# Patient Record
Sex: Female | Born: 1975 | Race: White | Hispanic: No | Marital: Married
Health system: Midwestern US, Community
[De-identification: ages and names within clinical notes are randomized; demographics above are authoritative.]

## PROBLEM LIST (undated history)

## (undated) DIAGNOSIS — R3129 Other microscopic hematuria: Secondary | ICD-10-CM

## (undated) DIAGNOSIS — R319 Hematuria, unspecified: Secondary | ICD-10-CM

## (undated) DIAGNOSIS — N800 Endometriosis of uterus: Secondary | ICD-10-CM

## (undated) DIAGNOSIS — N8003 Adenomyosis of the uterus: Secondary | ICD-10-CM

## (undated) DIAGNOSIS — R569 Unspecified convulsions: Secondary | ICD-10-CM

## (undated) DIAGNOSIS — I10 Essential (primary) hypertension: Secondary | ICD-10-CM

## (undated) DIAGNOSIS — N83209 Unspecified ovarian cyst, unspecified side: Secondary | ICD-10-CM

## (undated) DIAGNOSIS — N809 Endometriosis, unspecified: Secondary | ICD-10-CM

---

## 2014-04-15 ENCOUNTER — Ambulatory Visit
Admit: 2014-04-15 | Discharge: 2014-04-15 | Payer: PRIVATE HEALTH INSURANCE | Attending: Urology | Primary: Family Medicine

## 2014-04-15 DIAGNOSIS — R3129 Other microscopic hematuria: Secondary | ICD-10-CM

## 2014-04-15 LAB — AMB POC URINALYSIS DIP STICK AUTO W/ MICRO (MICRO RESULTS)
Bilirubin (UA POC): NEGATIVE
Glucose (UA POC): NEGATIVE
Ketones (UA POC): NEGATIVE
Leukocyte esterase (UA POC): NEGATIVE
Nitrites (UA POC): NEGATIVE
Protein (UA POC): NEGATIVE mg/dL
Specific gravity (UA POC): 1.015 (ref 1.001–1.035)
Urobilinogen (UA POC): 0.2 (ref 0.2–1)
pH (UA POC): 7 (ref 4.6–8.0)

## 2014-04-15 NOTE — Progress Notes (Signed)
ASSESSMENT:     ICD-10-CM ICD-9-CM    1. Microscopic hematuria R31.2 599.72 AMB POC URINALYSIS DIP STICK AUTO W/ MICRO (MICRO RESULTS)      [place] RETROPERITONEUM COMP   2. History of UTI Z87.440 V13.02               PLAN:    The American Urological Association's Best Practice Policy Panel on asymptomatic microscopic hematuria defines microscopic hematuria as three or more red blood cells per high-power microscopic field in urinary sediment from two of three properly collected urinalysis specimens.  According to this patient's history, the criteria for a urologic workup are met and we will proceed accordingly with upper tract imaging. Discussed causes for microscopic hematuria, which include renal stones, malignancy and UTI.     As she is low risk for significant abnormalities, I advised patient to proceed with a renal US. This is to be ordered and scheduled today.    Urine for culture today. Will contact patient with results.  RTC 1 month to review imaging studies. If imaging is negative, she will return in 1 year with UA and micro at that time.          Chief Complaint   Patient presents with   ??? Referral / Consult     Patient referred for blood in urine. Patient was treated with ABX by PCP x yr       HISTORY OF PRESENT ILLNESS:  Michele Choi is a 38 y.o. female who is seen in consultation as referred by Dr. Avelina Laine for hematuria. She is a former smoker - 1/2 PPD 4 pack years. UA on 10/12 by her PCP showed moderate blood (3-5 RBC per HPF).     She has a longstanding history of microscopic hematuria dating back to 2008. She was treated 1 year ago with abx for the microscopic hematuria.     She has 2 children (1 vaginal birth and 1 C section). She denies history of kidney stones. She has a history of UTI - none in the past several years. She reports when she was pregnant her clean catch urine always showed Group B strep. Cathed specimens were negative. f     She currently denies urinary difficulty, dysuria, gross hematuria and incontinence. She does note bilateral flank pain that radiates to the groin - lasts 20-30 seconds at most and happens once a day. UA today shows trace blood, otherwise normal. Micro normal            Past Medical History   Diagnosis Date   ??? Pet allergy    ??? Abnormal Pap smear of vagina and vaginal HPV    ??? Cardiac arrhythmia    ??? Paroxysmal ventricular tachycardia ([place])    ??? PCOS (polycystic ovarian syndrome)        Past Surgical History   Procedure Laterality Date   ??? Hx leep procedure     ??? Hx acl reconstruction     ??? Hx meniscectomy     ??? Hx cyst removal     ??? Hx colonoscopy     ??? Hx wisdom teeth extraction     ??? Hx oophorectomy         History   Substance Use Topics   ??? Smoking status: Former Smoker   ??? Smokeless tobacco: Not on file   ??? Alcohol Use: Yes       Allergies   Allergen Reactions   ??? Avelox [Moxifloxacin] Other (comments)   ???  Keflex [Cephalexin] Other (comments)       Family History   Problem Relation Age of Onset   ??? Hypertension Mother    ??? Other Mother      heart valve replacement   ??? Cancer Father      Prostate       Current Outpatient Prescriptions   Medication Sig Dispense Refill   ??? labetalol (NORMODYNE) 100 mg tablet   0   ??? valACYclovir (VALTREX) 500 mg tablet   0         Review of Systems  Constitutional: Fever: No  Skin: Rash: No  HEENT: Hearing difficulty: No  Eyes: Blurred vision: Yes  seen by eye doctor  Cardiovascular: Chest pain: No  Respiratory: Shortness of breath: No  Gastrointestinal: Nausea/vomiting: No  Musculoskeletal: Back pain: No  Neurological: Weakness: No  Psychological: Memory loss: No  Comments/additional findings:         PHYSICAL EXAMINATION:     BP 110/70 mmHg   Ht '5\' 6"'  (1.676 m)   Wt 217 lb (98.431 kg)   BMI 35.04 kg/m2  Constitutional: Well developed, well-nourished female in no acute distress.   CV:  No peripheral swelling noted  Respiratory: No respiratory distress or difficulties   Abdomen:  Soft and nontender. No masses. No hepatosplenomegaly.   GU Female:  No CVA tenderness.     Skin:  Normal color. No evidence of jaundice.     Neuro/Psych:  Patient with appropriate affect.  Alert and oriented.    Lymphatic:   No enlargement of supraclavicular lymph nodes.      REVIEW OF LABS AND IMAGING:      Results for orders placed or performed in visit on 04/15/14   AMB POC URINALYSIS DIP STICK AUTO W/ MICRO (MICRO RESULTS)   Result Value Ref Range    Color (UA POC) Yellow     Clarity (UA POC) Clear     Glucose (UA POC) Negative Negative    Bilirubin (UA POC) Negative Negative    Ketones (UA POC) Negative Negative    Specific gravity (UA POC) 1.015 1.001 - 1.035    Blood (UA POC) Trace Negative    pH (UA POC) 7.0 4.6 - 8.0    Protein (UA POC) Negative Negative mg/dL    Urobilinogen (UA POC) 0.2 mg/dL 0.2 - 1    Nitrites (UA POC) Negative Negative    Leukocyte esterase (UA POC) Negative Negative    Epithelial cells (UA POC)      WBCs (UA POC)      RBCs (UA POC)      Bacteria (UA POC)  Negative    Crystals (UA POC)  Negative    Other (UA POC)             A copy of today's office visit with all pertinent imaging results and labs were sent to the referring physician.        Kathreen Cornfield, MD         Documentation provided by Fredrik Cove, medical scribe for Dr. Quinn Axe, on 04/15/2014

## 2014-04-17 LAB — URINE C&S

## 2014-05-12 ENCOUNTER — Encounter

## 2014-05-13 ENCOUNTER — Ambulatory Visit
Admit: 2014-05-13 | Discharge: 2014-05-13 | Payer: PRIVATE HEALTH INSURANCE | Attending: Urology | Primary: Family Medicine

## 2014-05-13 DIAGNOSIS — R319 Hematuria, unspecified: Secondary | ICD-10-CM

## 2014-05-13 LAB — AMB POC URINALYSIS DIP STICK AUTO W/O MICRO
Bilirubin (UA POC): NEGATIVE
Glucose (UA POC): NEGATIVE
Ketones (UA POC): NEGATIVE
Leukocyte esterase (UA POC): NEGATIVE
Nitrites (UA POC): NEGATIVE
Protein (UA POC): NEGATIVE mg/dL
Specific gravity (UA POC): 1.025 (ref 1.001–1.035)
Urobilinogen (UA POC): 0.2 (ref 0.2–1)
pH (UA POC): 5.5 (ref 4.6–8.0)

## 2014-05-13 NOTE — Progress Notes (Signed)
ASSESSMENT:     ICD-10-CM ICD-9-CM    1. Hematuria R31.9 599.70 AMB POC URINALYSIS DIP STICK AUTO W/O MICRO             PLAN:    RUS reviewed with patient  Urine C&S reviewed with patient, 30k Beta Strep species on 04/15/14  Urine for culture today  Discussed bladder wall thickening with patient - discussed that cysto would be low yield but patient is worried about  F/u in 2 weeks for cystoscopy    DISCUSSION:  The American Urological Association's Best Practice Policy Panel on asymptomatic microscopic hematuria defines microscopic hematuria as three or more red blood cells per high-power microscopic field in urinary sediment from two of three properly collected urinalysis specimens.  According to this patient's history, the criteria for a urologic workup are met and we will proceed accordingly with upper tract imaging. Discussed causes for microscopic hematuria, which include renal stones, malignancy and UTI.     Chief Complaint   Patient presents with   ??? Hematuria       HISTORY OF PRESENT ILLNESS:  Michele Choi is a 38 y.o. female who is seen in consultation as referred by Dr. Avelina Laine for hematuria. She is a former smoker - 1/2 PPD for 7 years. UA on 10/12 by her PCP showed moderate blood (3-5 RBC per HPF).     She has a longstanding history of microscopic hematuria dating back to 2008. She was treated 1 year ago with abx for the microscopic hematuria.     She has 2 children (1 vaginal birth and 1 C section). She denies history of kidney stones. She has a history of UTI - none in the past several years. She reports when she was pregnant her clean catch urine always showed Group B strep. Cathed specimens were negative.     She continues to deny major urinary difficulties, dysuria, gross hematuria and incontinence. UA today continues to show trace blood, otherwise normal.   She reports a weaker flow in the morning.     Patient is currently on Labetalol, prescribed by cardiologist while she was pregnant.        Past Medical History   Diagnosis Date   ??? Pet allergy    ??? Abnormal Pap smear of vagina and vaginal HPV    ??? Cardiac arrhythmia    ??? Paroxysmal ventricular tachycardia ([place])    ??? PCOS (polycystic ovarian syndrome)    ??? Hematuria    ??? History of UTI        Past Surgical History   Procedure Laterality Date   ??? Hx leep procedure     ??? Hx acl reconstruction     ??? Hx meniscectomy     ??? Hx cyst removal     ??? Hx colonoscopy     ??? Hx wisdom teeth extraction     ??? Hx oophorectomy         History   Substance Use Topics   ??? Smoking status: Former Smoker   ??? Smokeless tobacco: Not on file   ??? Alcohol Use: Yes       Allergies   Allergen Reactions   ??? Avelox [Moxifloxacin] Other (comments)   ??? Keflex [Cephalexin] Other (comments)       Family History   Problem Relation Age of Onset   ??? Hypertension Mother    ??? Other Mother      heart valve replacement   ??? Cancer Father  Prostate       Current Outpatient Prescriptions   Medication Sig Dispense Refill   ??? labetalol (NORMODYNE) 100 mg tablet   0   ??? valACYclovir (VALTREX) 500 mg tablet   0       Review of Systems  Constitutional: Fever: No  Skin: Rash: No  HEENT: Hearing difficulty: No  Eyes: Blurred vision: No  Cardiovascular: Chest pain: No  Respiratory: Shortness of breath: No  Gastrointestinal: Nausea/vomiting: No  Musculoskeletal: Back pain: No  Neurological: Weakness: No  Psychological: Memory loss: No  Comments/additional findings:       PHYSICAL EXAMINATION:   BP 110/79 mmHg   Ht '5\' 6"'  (1.676 m)   Wt 217 lb (98.431 kg)   BMI 35.04 kg/m2  Constitutional: Well developed, well-nourished female in no acute distress.   CV:  No peripheral swelling noted  Respiratory: No respiratory distress or difficulties  Abdomen:  Soft and nontender. No masses. No hepatosplenomegaly.   GU Female:  No CVA tenderness.     Skin:  Normal color. No evidence of jaundice.     Neuro/Psych:  Patient with appropriate affect.  Alert and oriented.     Lymphatic:   No enlargement of supraclavicular lymph nodes.      REVIEW OF LABS AND IMAGING:    Results for orders placed or performed in visit on 05/13/14   AMB POC URINALYSIS DIP STICK AUTO W/O MICRO   Result Value Ref Range    Color (UA POC)      Clarity (UA POC)      Glucose (UA POC) Negative Negative    Bilirubin (UA POC) Negative Negative    Ketones (UA POC) Negative Negative    Specific gravity (UA POC) 1.025 1.001 - 1.035    Blood (UA POC) Trace Negative    pH (UA POC) 5.5 4.6 - 8.0    Protein (UA POC) Negative Negative mg/dL    Urobilinogen (UA POC) 0.2 mg/dL 0.2 - 1    Nitrites (UA POC) Negative Negative    Leukocyte esterase (UA POC) Negative Negative     RUS 04/28/14  Procedures Performed: Exam Date/Time: Reason for Exam:   [place] RETROPERITONEAL  [id number] 04/28/2014 8:23 AM None Specified     [place] RETROPERITONEAL  HISTORY: Microscopic hematuria.  FINDINGS:  Real-time sonogram of the kidneys and urinary bladder was done.  The kidneys are normal in echotexture and size. The corticomedullary echo differentiation is well-preserved.   No renal mass, stone or obstruction. The right kidney measures 11.6 x 4.6 x 5.2 cm and the left kidney measures 12.5 x 5.2 x 5.5 cm.  Urinary bladder is only minimally distended showing no definite stone or mass.   Under distention of the bladder resulted in slight bladder wall thickening. Ureteral jets demonstrated in both sides.    IMPRESSION:  No definite abnormalities of the kidneys. No gross abnormality of the urinary bladder allowing for suboptimal distention.    Dictated by: Carren Rang ALI on Tue Apr 28, 2014 8:33:00 AM EST     Signed GH:[id number], Mertie Clause, MD 04/28/2014 8:37 AM    Urine C&S 04/15/14  COLONY COUNT/RESULT:  30,000 cfu/ml    RESULT/ORG ID #1:  Beta Strep species, (NOT group B), Drug of Choice is Ampicillin or Penicillin per CLSI guidelines            A copy of today's office visit with all pertinent imaging results and labs  were sent to the referring physician.  Kathreen Cornfield, MD       Medical documentation is provided with the assistance of Nikki L. Yolanda Bonine, medical scribe for Kathreen Cornfield, MD on 05/13/2014.

## 2014-05-27 ENCOUNTER — Ambulatory Visit
Admit: 2014-05-27 | Discharge: 2014-05-27 | Payer: PRIVATE HEALTH INSURANCE | Attending: Urology | Primary: Family Medicine

## 2014-05-27 DIAGNOSIS — R3129 Other microscopic hematuria: Secondary | ICD-10-CM

## 2014-05-27 LAB — AMB POC URINALYSIS DIP STICK AUTO W/ MICRO (MICRO RESULTS)
Bilirubin (UA POC): NEGATIVE
Glucose (UA POC): NEGATIVE
Ketones (UA POC): NEGATIVE
Leukocyte esterase (UA POC): NEGATIVE
Nitrites (UA POC): NEGATIVE
Protein (UA POC): NEGATIVE mg/dL
Specific gravity (UA POC): 1.015 (ref 1.001–1.035)
Urobilinogen (UA POC): 0.2 (ref 0.2–1)
pH (UA POC): 5.5 (ref 4.6–8.0)

## 2014-05-27 NOTE — Progress Notes (Signed)
CYSTOSCOPY PROCEDURE        Patient Name: Michele Choi            Date of Procedure: 05/27/2014     Pre-procedure Diagnosis:     ICD-10-CM ICD-9-CM    1. Microscopic hematuria R31.2 599.72       Post-procedure Diagnosis: Same    Consent:  All risks, benefits and options were reviewed in detail and the patient agrees to procedure. Risks include but are not limited to bleeding, infection, sepsis, death, dysuria and others.     Procedure:  The patient was placed in the lithotomy position, and prepped and draped in the normal fashion. 5 ml of 4% Lidocaine gel was placed in the urethra. Once adequate anesthesia was achieved; the flexible cystoscope was placed into the bladder. The procedure was conducted with narrow band imaging and white light cystoscopy      Findings as follows:      Meatus: normal  Urethra: normal  Bladder neck: normal  Trigone:  normal  Trabeculation:normal  Diverticuli:  none  Lesion:  none    Antibiotic provided:  Yes     Lab / Imaging:   No results found for any visits on 05/27/14.     Diagnoses:       ICD-10-CM ICD-9-CM    1. Microscopic hematuria R31.2 599.72           SUMMARY OF VISIT AND PLAN:    38yoF with history of microscopic hematuria and smoking history  U Cx 30K strep  RUS w/o hydro/stones.  Noted some bladder wall thickening likely 2/2 collapsed bladder      F/u with UA and micro in 1 year      Manual Meierouglas C Juliahna Wiswell, MD

## 2015-11-10 ENCOUNTER — Encounter: Attending: Urology | Primary: Family Medicine

## 2015-11-10 NOTE — Telephone Encounter (Signed)
Called and cancelled today because she has a stomach bug and will call us to reschedule.

## 2016-02-09 ENCOUNTER — Ambulatory Visit
Admit: 2016-02-09 | Discharge: 2016-02-09 | Payer: PRIVATE HEALTH INSURANCE | Attending: Urology | Primary: Family Medicine

## 2016-02-09 DIAGNOSIS — R319 Hematuria, unspecified: Secondary | ICD-10-CM

## 2016-02-09 LAB — AMB POC URINALYSIS DIP STICK AUTO W/ MICRO (MICRO RESULTS)
Bilirubin (UA POC): NEGATIVE
Glucose (UA POC): NEGATIVE
Ketones (UA POC): NEGATIVE
Leukocyte esterase (UA POC): NEGATIVE
Nitrites (UA POC): NEGATIVE
Protein (UA POC): NEGATIVE mg/dL
Specific gravity (UA POC): 1.03 (ref 1.001–1.035)
Urobilinogen (UA POC): 0.2 (ref 0.2–1)
pH (UA POC): 5.5 (ref 4.6–8.0)

## 2016-02-09 NOTE — Progress Notes (Signed)
Michele Choi  DOB Dec 25, 1975  40 y.o.   02/11/2016       UROLOGY ESTABLISHED PATIENT       ASSESSMENT:     ICD-10-CM ICD-9-CM   1. Hematuria R31.9 599.70        40yoM with hx of persistent asymptomatic microscopic hematuria s/p hematuria work up 12/15 including cysto/RUS       PLAN:    Straight cath for urine sample today, moderate blood. 3-5 rbcs on micro  Patient with persistent microscopic hematuria  CTU ordered.   Cytology today, results pending.   Will complete repeat hematuria workup via cysto.   RTC in 3 weeks for cysto.       DISCUSSION:  The American Urological Association's Best Practice Policy Panel on asymptomatic microscopic hematuria defines microscopic hematuria as three or more red blood cells per high-power microscopic field in urinary sediment from two of three properly collected urinalysis specimens.  According to this patient's history, the criteria for a urologic workup are met and we will proceed accordingly with upper tract imaging. Discussed causes for microscopic hematuria, which include renal stones, malignancy and UTI.     Chief Complaint   Patient presents with   ??? Hematuria       HISTORY OF PRESENT ILLNESS:  Michele Choi is a 40 y.o. female who is seen in follow up for persistent asymptomatic microscopic hematuria.   Pt has a longstanding history of microscopic hematuria dating back to 2008.   H/o treatment 1 year ago with abx for the microscopic hematuria.   This did not resolve her chronic findings which continue.   UA today positive for 3+ blood w/o evidence of infection.   She is a former smoker - 1/2 PPD for 7 years.     Pt continues to deny major urinary difficulties.  Denies dysuria, gross hematuria and incontinence.  No h/o kidney stones.   She has a history of UTI - none in the past several years.   Pt has 2 children (1 vaginal birth and 1 C section).   RUS 11/15   Cysto 12/15 negative for etiology    Past Medical History:   Diagnosis Date    ??? Abnormal Pap smear of vagina and vaginal HPV    ??? Cardiac arrhythmia    ??? Hematuria    ??? History of UTI    ??? Paroxysmal ventricular tachycardia ([place])    ??? PCOS (polycystic ovarian syndrome)    ??? Pet allergy      Past Surgical History:   Procedure Laterality Date   ??? HX ACL RECONSTRUCTION     ??? HX COLONOSCOPY     ??? HX CYST REMOVAL     ??? HX LEEP PROCEDURE     ??? HX MENISCECTOMY     ??? HX OOPHORECTOMY     ??? HX WISDOM TEETH EXTRACTION       Social History   Substance Use Topics   ??? Smoking status: Former Smoker   ??? Smokeless tobacco: None   ??? Alcohol use Yes     Allergies   Allergen Reactions   ??? Avelox [Moxifloxacin] Other (comments)   ??? Keflex [Cephalexin] Other (comments)     Family History   Problem Relation Age of Onset   ??? Hypertension Mother    ??? Other Mother      heart valve replacement   ??? Cancer Father      Prostate     Current Outpatient Prescriptions  Medication Sig Dispense Refill   ??? JUNEL FE 1/20, 28, 1 mg-20 mcg (21)/75 mg (7) tab take 1 tablet by mouth once daily  0   ??? labetalol (NORMODYNE) 100 mg tablet   0   ??? valACYclovir (VALTREX) 500 mg tablet   0       Review of Systems  Constitutional: Fever: No  Skin: Rash: No  HEENT: Hearing difficulty: No  Eyes: Blurred vision: No  Cardiovascular: Chest pain: No  Respiratory: Shortness of breath: No  Gastrointestinal: Nausea/vomiting: No  Musculoskeletal: Back pain: Yes  occasional radiating lower back pain  Neurological: Weakness:   Psychological: Memory loss: No  Comments/additional findings:       PHYSICAL EXAMINATION:   Visit Vitals   ??? BP 116/66   ??? Ht '5\' 6"'  (1.676 m)   ??? Wt 230 lb (104.3 kg)   ??? BMI 37.12 kg/m2   Constitutional: Well developed, well-nourished female in no acute distress.   CV:  No peripheral swelling noted  Respiratory: No respiratory distress or difficulties  Abdomen:  Soft and nontender. No masses. No hepatosplenomegaly.   GU Female:  No CVA tenderness.     Skin:  Normal color. No evidence of jaundice.      Neuro/Psych:  Patient with appropriate affect.  Alert and oriented.    Lymphatic:   No enlargement of supraclavicular lymph nodes.      REVIEW OF LABS AND IMAGING:    Results for orders placed or performed in visit on 02/09/16   URINE C&S   Result Value Ref Range    FINAL REPORT Microbiology results    AMB POC URINALYSIS DIP STICK AUTO W/ MICRO (MICRO RESULTS)   Result Value Ref Range    Color (UA POC)      Clarity (UA POC)      Glucose (UA POC) Negative Negative    Bilirubin (UA POC) Negative Negative    Ketones (UA POC) Negative Negative    Specific gravity (UA POC) 1.030 1.001 - 1.035    Blood (UA POC) 2+ Negative    pH (UA POC) 5.5 4.6 - 8.0    Protein (UA POC) Negative Negative mg/dL    Urobilinogen (UA POC) 0.2 mg/dL 0.2 - 1    Nitrites (UA POC) Negative Negative    Leukocyte esterase (UA POC) Negative Negative    Epithelial cells (UA POC)      WBCs (UA POC) 5-10     RBCs (UA POC) 3-5     Bacteria (UA POC)  Negative    Crystals (UA POC)  Negative    Other (UA POC)         CMP 12/29/2015    Component Name Value Ref Range   POTASSIUM 4.2 3.5 - 5.5 mmol/L   SODIUM 137 133 - 145 mmol/L   CHLORIDE 96 (L) 98 - 110 mmol/L   GLUCOSE 81 65 - 99 mg/dL   CALCIUM 9.0 8.4 - 10.5 mg/dL   ALBUMIN 4.4 3.5 - 5.0 g/dL   SGPT (ALT) 11 5 - 40 U/L   SGOT (AST) 11 10 - 37 U/L   BILIRUBIN TOTAL 0.6 0.2 - 1.2 mg/dL   ALKALINE PHOSPHATASE 69 25 - 115 U/L   BUN 11 6 - 22 mg/dL   CO2 26 20 - 32 mmol/L   CREATININE 0.7 0.5 - 1.2 mg/dL   eGFR African American >60.0 >60.0    eGFR Non African American >60.0 >60.0    GLOBULIN SERUM 2.7 2.0 - 4.0  g/dL   A/G RATIO 1.6 1.1 - 2.6 ratio    TOTAL PROTEIN 7.1 6.4 - 8.3 g/dL   ANION GAP 15.0 mmol/L   Specimen   Blood         A copy of today's office visit with all pertinent imaging results and labs were sent to the referring physician.        Kathreen Cornfield, MD  [facility]  [place], [place]  Phone: [phone]  Pager: [phone]        Patient's BMI is out of the normal parameters.  Information about BMI was given to the patient.       Medical Documentation is provided with the assistance of Taj-ud-Din Arsenio Loader, Medical Scribe for Kathreen Cornfield, MD on 02/09/2016.

## 2016-02-11 LAB — CYTOLOGY NON GYN, UROLOGY OF ~~LOC~~ LAB

## 2016-02-11 LAB — URINE C&S

## 2016-02-11 NOTE — Progress Notes (Signed)
Reviewed, follow up as scheduled

## 2016-02-15 NOTE — Progress Notes (Signed)
Appointment Information   Name: Michele Choi, Michele Choi MRN: [id number]   Date: 02/25/2016 Status: Sch   Time: 1:40 PM Length: 20   Visit Type: CT UROGRAPHY [15319] Visit #: [id number]   Prov/Res: [facility] CT RM 1 Department: [facility] CAT SCAN   Ordering Provider: Manual Meier

## 2016-02-15 NOTE — Progress Notes (Signed)
Faxed imaging to [facility]

## 2016-03-02 ENCOUNTER — Encounter

## 2016-03-03 ENCOUNTER — Ambulatory Visit
Admit: 2016-03-03 | Discharge: 2016-03-03 | Payer: PRIVATE HEALTH INSURANCE | Attending: Urology | Primary: Family Medicine

## 2016-03-03 DIAGNOSIS — R319 Hematuria, unspecified: Secondary | ICD-10-CM

## 2016-03-03 LAB — AMB POC URINALYSIS DIP STICK AUTO W/O MICRO
Bilirubin (UA POC): NEGATIVE
Glucose (UA POC): NEGATIVE
Ketones (UA POC): NEGATIVE
Leukocyte esterase (UA POC): NEGATIVE
Nitrites (UA POC): NEGATIVE
Protein (UA POC): NEGATIVE mg/dL
Specific gravity (UA POC): 1.03 (ref 1.001–1.035)
Urobilinogen (UA POC): 0.2 (ref 0.2–1)
pH (UA POC): 5.5 (ref 4.6–8.0)

## 2016-03-03 NOTE — Progress Notes (Signed)
LAYNEE LOCKAMY  DOB 11/08/1975   Encounter date 03/03/2016      CYSTOSCOPY PROCEDURE        Patient Name: Michele Choi            Date of Procedure: 03/03/2016     Pre-procedure Diagnosis:     ICD-10-CM ICD-9-CM    1. Hematuria R31.9 599.70 AMB POC URINALYSIS DIP STICK AUTO W/O MICRO      CYSTOURETHROSCOPY      Persistent microscopic hematuria.   - Cytology 02/09/16: negative for malignancy    - CTU 02/28/16: no evidence of urothelial carcinoma    - S/p cystoscopy 03/03/16: normal findings    Post-procedure Diagnosis: Same    Indication:   Michele Choi is a 40 y.o. female who is seen in follow up for persistent asymptomatic microscopic hematuria dating back to 2008.     She is a former smoker - 1/2 PPD for 7 years.     In regards to voiding, she has no major complaints  Denies any gross hematuria or dysuria  No significant urgency, frequency, hesitancy, intermittency, post-void dribbling, or incontinence.  No history of nephrolithiasis   No significant history of UTIs    Pt has 2 children (1 vaginal birth and 1 C section).     Consent:  All risks, benefits and options were reviewed in detail and the patient agrees to procedure. Risks include but are not limited to bleeding, infection, sepsis, death, dysuria and others.     Procedure:  The patient was placed in the lithotomy position, and prepped and draped in the normal fashion. 5 ml of 4% Lidocaine gel was placed in the urethra. Once adequate anesthesia was achieved; the flexible cystoscope was placed into the bladder.     Findings as follows:      Meatus: normal  Urethra: normal  Bladder neck: normal  Trigone:  normal  Trabeculation:normal  Diverticuli:  none  Lesion:  none    Antibiotic provided:  Yes     Lab / Imaging:   Results for orders placed or performed in visit on 03/03/16   AMB POC URINALYSIS DIP STICK AUTO W/O MICRO   Result Value Ref Range    Color (UA POC)      Clarity (UA POC)      Glucose (UA POC) Negative Negative    Bilirubin (UA POC) Negative Negative     Ketones (UA POC) Negative Negative    Specific gravity (UA POC) 1.030 1.001 - 1.035    Blood (UA POC) 2+ Negative    pH (UA POC) 5.5 4.6 - 8.0    Protein (UA POC) Negative Negative mg/dL    Urobilinogen (UA POC) 0.2 mg/dL 0.2 - 1    Nitrites (UA POC) Negative Negative    Leukocyte esterase (UA POC) Negative Negative      Cytology 02/09/16  CYTOLOGIC DIAGNOSIS:   ??   Negative for malignancy. ??Reactive urothelial cells.     CT Urogram 02/28/16  IMPRESSION:  ??  1. No evidence of nephrolithiasis or obstructive nephropathy.  2. No filling defects within the renal collecting systems, ureters, or urinary bladder to suggest the presence of urothelial carcinoma.    Diagnoses:       ICD-10-CM ICD-9-CM    1. Hematuria R31.9 599.70 AMB POC URINALYSIS DIP STICK AUTO W/O MICRO      CYSTOURETHROSCOPY      40yoF with persistent AMH    SUMMARY OF VISIT AND PLAN:  CTU reviewed - no evidence of malignancy   Cytology negative  Cystoscopy today - normal findings  Patient has had multiple negative hematuria workups  Will follow up PRN - instructed patient to follow up if gross hematuria      Manual Meier, MD            Medical documentation provided with the assistance of Glo Herring, medical scribe for Manual Meier, MD

## 2020-12-26 ENCOUNTER — Emergency Department (HOSPITAL_COMMUNITY)
Admission: EM | Admit: 2020-12-26 | Discharge: 2020-12-26 | Disposition: A | Discharge status: Home / Self Care | Payer: Commercial Managed Care - HMO | Attending: Emergency Medicine | Admitting: Emergency Medicine

## 2020-12-26 ENCOUNTER — Other Ambulatory Visit: Payer: Self-pay

## 2020-12-26 ENCOUNTER — Encounter (HOSPITAL_COMMUNITY): Payer: Self-pay

## 2020-12-26 ENCOUNTER — Emergency Department (HOSPITAL_COMMUNITY): Payer: Commercial Managed Care - HMO

## 2020-12-26 DIAGNOSIS — Z87440 Personal history of urinary (tract) infections: Secondary | ICD-10-CM | POA: Diagnosis not present

## 2020-12-26 DIAGNOSIS — D259 Leiomyoma of uterus, unspecified: Secondary | ICD-10-CM

## 2020-12-26 DIAGNOSIS — R102 Pelvic and perineal pain: Secondary | ICD-10-CM

## 2020-12-26 DIAGNOSIS — I10 Essential (primary) hypertension: Secondary | ICD-10-CM | POA: Diagnosis not present

## 2020-12-26 DIAGNOSIS — N809 Endometriosis, unspecified: Secondary | ICD-10-CM | POA: Diagnosis not present

## 2020-12-26 DIAGNOSIS — Z113 Encounter for screening for infections with a predominantly sexual mode of transmission: Secondary | ICD-10-CM | POA: Insufficient documentation

## 2020-12-26 DIAGNOSIS — R1031 Right lower quadrant pain: Secondary | ICD-10-CM | POA: Diagnosis present

## 2020-12-26 DIAGNOSIS — D251 Intramural leiomyoma of uterus: Secondary | ICD-10-CM | POA: Diagnosis not present

## 2020-12-26 DIAGNOSIS — Z880 Allergy status to penicillin: Secondary | ICD-10-CM | POA: Insufficient documentation

## 2020-12-26 DIAGNOSIS — Z881 Allergy status to other antibiotic agents status: Secondary | ICD-10-CM | POA: Diagnosis not present

## 2020-12-26 HISTORY — DX: Endometriosis, unspecified: N80.9

## 2020-12-26 HISTORY — DX: Unspecified ovarian cyst, unspecified side: N83.209

## 2020-12-26 HISTORY — DX: Unspecified convulsions: R56.9

## 2020-12-26 HISTORY — DX: Essential (primary) hypertension: I10

## 2020-12-26 HISTORY — DX: Adenomyosis of the uterus: N80.03

## 2020-12-26 HISTORY — DX: Endometriosis of uterus: N80.0

## 2020-12-26 LAB — CBC WITH DIFFERENTIAL/PLATELET
Abs Immature Granulocytes: 0.03 10*3/uL (ref 0.00–0.07)
Basophils Absolute: 0.1 10*3/uL (ref 0.0–0.1)
Basophils Relative: 1 %
Eosinophils Absolute: 0.2 10*3/uL (ref 0.0–0.5)
Eosinophils Relative: 2 %
HCT: 41.1 % (ref 36.0–46.0)
Hemoglobin: 13.4 g/dL (ref 12.0–15.0)
Immature Granulocytes: 0 %
Lymphocytes Relative: 24 %
Lymphs Abs: 2.2 10*3/uL (ref 0.7–4.0)
MCH: 26.5 pg (ref 26.0–34.0)
MCHC: 32.6 g/dL (ref 30.0–36.0)
MCV: 81.4 fL (ref 80.0–100.0)
Monocytes Absolute: 0.6 10*3/uL (ref 0.1–1.0)
Monocytes Relative: 7 %
Neutro Abs: 6.1 10*3/uL (ref 1.7–7.7)
Neutrophils Relative %: 66 %
Platelets: 274 10*3/uL (ref 150–400)
RBC: 5.05 MIL/uL (ref 3.87–5.11)
RDW: 13.2 % (ref 11.5–15.5)
WBC: 9.2 10*3/uL (ref 4.0–10.5)
nRBC: 0 % (ref 0.0–0.2)

## 2020-12-26 LAB — WET PREP, GENITAL
Sperm: NONE SEEN
Trich, Wet Prep: NONE SEEN
Yeast Wet Prep HPF POC: NONE SEEN

## 2020-12-26 LAB — URINALYSIS, ROUTINE W REFLEX MICROSCOPIC
Bacteria, UA: NONE SEEN
Bilirubin Urine: NEGATIVE
Glucose, UA: NEGATIVE mg/dL
Ketones, ur: NEGATIVE mg/dL
Nitrite: NEGATIVE
Protein, ur: NEGATIVE mg/dL
Specific Gravity, Urine: 1.023 (ref 1.005–1.030)
pH: 5 (ref 5.0–8.0)

## 2020-12-26 LAB — BASIC METABOLIC PANEL
Anion gap: 12 (ref 5–15)
BUN: 11 mg/dL (ref 6–20)
CO2: 27 mmol/L (ref 22–32)
Calcium: 9.4 mg/dL (ref 8.9–10.3)
Chloride: 99 mmol/L (ref 98–111)
Creatinine, Ser: 0.75 mg/dL (ref 0.44–1.00)
GFR, Estimated: >60 mL/min (ref >60)
Glucose, Bld: 93 mg/dL (ref 70–99)
Potassium: 3.4 mmol/L — ABNORMAL LOW (ref 3.5–5.1)
Sodium: 138 mmol/L (ref 135–145)

## 2020-12-26 LAB — PREGNANCY, URINE: Preg Test, Ur: NEGATIVE

## 2020-12-26 LAB — HIV ANTIBODY (ROUTINE TESTING W REFLEX): HIV Screen 4th Generation wRfx: NONREACTIVE

## 2020-12-26 NOTE — ED Notes (Signed)
Urine Culture sent with specimen

## 2020-12-26 NOTE — ED Triage Notes (Signed)
Pt reports lower abdominal pain x2 weeks. Pt reports taking antibiotics for UTI, and symptoms have not resolved. Pt denies N/V/D, vaginal bleeding, and rectal bleeding. Pt unsure if she had a period last month.

## 2020-12-26 NOTE — ED Provider Notes (Signed)
[place] DEPT Provider Note   CSN: [id number] Arrival date & time: 12/26/20  1503     History Chief Complaint  Patient presents with   Abdominal Pain    Erica Christensen is a 45 y.o. female.  HPI She reports lower abdominal pain, present for 2 days, which improves when taking ibuprofen.  She also had urinary frequency without bleeding.  She occasionally has a sensation that her right leg will give out, because of discomfort in her right lower abdomen.  She was treated for UTI with Macrobid, 2 weeks ago.  She denies vaginal discharge.  Her periods are regular since she had a uterine ablation.  This procedure was apparently done because of heavy bleeding, at which time she was diagnosed with endometriosis and adenomyosis.  She has a history of ovarian cyst but cannot recall if they are bilateral.  She denies fever, chills, vomiting, cough or chest pain.  She denies vaginal bleeding or vaginal discharge at this time.  There are no other known active modifying factors.    Past Medical History:  Diagnosis Date   Adenomyosis    Endometriosis    Hypertension    Ovarian cyst    Seizures ([place])     There are no problems to display for this patient.   History reviewed. No pertinent surgical history.   OB History   No obstetric history on file.     History reviewed. No pertinent family history.     Home Medications Prior to Admission medications   Not on File    Allergies    Allegra [fexofenadine], Amoxicillin, Augmentin [amoxicillin-pot clavulanate], Azithromycin, Bactrim [sulfamethoxazole-trimethoprim], and Penicillins  Review of Systems   Review of Systems  All other systems reviewed and are negative.  Physical Exam Updated Vital Signs BP 103/70   Pulse 67   Temp 97.9 F (36.6 C) (Oral)   Resp 13   Ht 5\' 3"  (1.6 m)   Wt 98.4 kg   LMP  (LMP Unknown)   SpO2 100%   BMI 38.44 kg/m   Physical Exam Vitals and nursing note reviewed.   Constitutional:      Appearance: She is well-developed. She is obese.  HENT:     Head: Normocephalic and atraumatic.  Eyes:     Conjunctiva/sclera: Conjunctivae normal.     Pupils: Pupils are equal, round, and reactive to light.  Neck:     Trachea: Phonation normal.  Cardiovascular:     Rate and Rhythm: Normal rate.  Pulmonary:     Effort: Pulmonary effort is normal.  Chest:     Chest wall: No tenderness.  Abdominal:     General: There is no distension.     Palpations: Abdomen is soft.     Tenderness: There is abdominal tenderness (Diffuse lower quadrants bilaterally). There is no guarding or rebound.  Genitourinary:    Comments: Normal external female genitalia.  No vaginal discharge.  Normal-appearing cervix.  No cervical motion tenderness.  On bimanual examination is no palpable organomegaly.  Exam limited secondary to body habitus. Musculoskeletal:        General: Normal range of motion.     Cervical back: Normal range of motion and neck supple.  Skin:    General: Skin is warm and dry.  Neurological:     Mental Status: She is alert and oriented to person, place, and time.     Motor: No abnormal muscle tone.  Psychiatric:        Mood and  Affect: Mood normal.        Behavior: Behavior normal.        Thought Content: Thought content normal.        Judgment: Judgment normal.    ED Results / Procedures / Treatments   Labs (all labs ordered are listed, but only abnormal results are displayed) Labs Reviewed  WET PREP, GENITAL - Abnormal; Notable for the following components:      Result Value   Clue Cells Wet Prep HPF POC PRESENT (*)    WBC, Wet Prep HPF POC RARE (*)    All other components within normal limits  URINALYSIS, ROUTINE W REFLEX MICROSCOPIC - Abnormal; Notable for the following components:   Hgb urine dipstick SMALL (*)    Leukocytes,Ua TRACE (*)    All other components within normal limits  BASIC METABOLIC PANEL - Abnormal; Notable for the following  components:   Potassium 3.4 (*)    All other components within normal limits  CBC WITH DIFFERENTIAL/PLATELET  PREGNANCY, URINE  RPR  HIV ANTIBODY (ROUTINE TESTING W REFLEX)  GC/CHLAMYDIA PROBE AMP ([place]) NOT AT [facility]    EKG None  Radiology US PELVIC COMPLETE WITH TRANSVAGINAL  Result Date: 12/26/2020 CLINICAL DATA:  Pelvic pain.  Endometriosis. EXAM: TRANSABDOMINAL AND TRANSVAGINAL ULTRASOUND OF PELVIS TECHNIQUE: Both transabdominal and transvaginal ultrasound examinations of the pelvis were performed. Transabdominal technique was performed for global imaging of the pelvis including uterus, ovaries, adnexal regions, and pelvic cul-de-sac. It was necessary to proceed with endovaginal exam following the transabdominal exam to visualize the endometrium. COMPARISON:  None FINDINGS: Uterus Measurements: 12.2 x 6.3 x 8.1 cm = volume: 325 mL. Myometrial mass in the anterior uterine fundus measures 2.3 x 1.8 x 2.2 cm. Endometrium Thickness: Cannot accurately measure, heterogeneous appearance. Right ovary Measurements: 2.5 x 1.4 x 1.7 cm = volume: 3.0 mL. Normal appearance/no adnexal mass. Left ovary Measurements: 2.9 x 1.8 x 2.3 cm = volume: 6.2 mL. Normal appearance/no adnexal mass. Other findings No abnormal free fluid. IMPRESSION: Heterogeneous thickened endometrium with a focal masslike lesion measuring 3.4 x 2.7 x 3.8 cm. Clinical correlation is recommended. If further imaging is required, MRI of the pelvis with contrast may be considered. 2.3 cm fundal intramural fibroid. Normal appearance of the ovaries. Electronically Signed   By: Fidela Salisbury M.D.   On: 12/26/2020 18:52    Procedures Procedures   Medications Ordered in ED Medications - No data to display  ED Course  I have reviewed the triage vital signs and the nursing notes.  Pertinent labs & imaging results that were available during my care of the patient were reviewed by me and considered in my medical decision making  (see chart for details).    MDM Rules/Calculators/A&P                           Patient Vitals for the past 24 hrs:  BP Temp Temp src Pulse Resp SpO2 Height Weight  12/26/20 2015 -- -- -- 67 13 100 % -- --  12/26/20 2000 103/70 -- -- 60 20 100 % -- --  12/26/20 1945 -- -- -- 72 20 99 % -- --  12/26/20 1930 126/69 -- -- 66 10 98 % -- --  12/26/20 1915 -- -- -- 65 18 99 % -- --  12/26/20 1900 134/64 -- -- 73 19 100 % -- --  12/26/20 1845 -- -- -- 64 15 99 % -- --  12/26/20 1830 (!) 140/124 -- -- 68 16 100 % -- --  12/26/20 1815 -- -- -- 68 18 100 % -- --  12/26/20 1800 125/84 -- -- 82 18 100 % -- --  12/26/20 1745 -- -- -- 65 18 100 % -- --  12/26/20 1730 -- -- -- 64 14 98 % -- --  12/26/20 1715 -- -- -- 60 16 99 % -- --  12/26/20 1700 -- -- -- 70 18 100 % -- --  12/26/20 1645 105/63 -- -- 64 16 100 % -- --  12/26/20 1630 -- -- -- 85 (!) 22 99 % -- --  12/26/20 1615 -- -- -- 65 19 98 % -- --  12/26/20 1612 112/70 -- -- 65 16 98 % -- --  12/26/20 1513 140/86 97.9 F (36.6 C) Oral 72 16 99 % -- --  12/26/20 1511 -- -- -- -- -- -- 5\' 3"  (1.6 m) 98.4 kg    8:36 PM Reevaluation with update and discussion. After initial assessment and treatment, an updated evaluation reveals no change in clinical status, plan discussed with the patient and all questions were answered. Daleen Bo   Medical Decision Making:  This patient is presenting for evaluation of lower abdominal pain with urinary tract symptoms and history of pelvic disorder, which does require a range of treatment options, and is a complaint that involves a moderate risk of morbidity and mortality. The differential diagnoses include PID, UTI, intestinal disorder, ovarian cyst, uterine fibroids. I decided to review old records, and in summary Young adult with stated history of ovarian cyst, and uterine fibroids who is status post uterine ablation for heavy bleeding.  I did not require additional historical information from  anyone.  Clinical Laboratory Tests Ordered, included CBC and Metabolic panel.  Pregnancy test, wet prep.  Review indicates normal except potassium slightly low at 3.4.  Wet prep with clue cells and rare white cells Radiologic Tests Ordered, included pelvic ultrasound.  I independently Visualized: Radiographic images, which show uterine fibroid/uterine mass    Critical Interventions-clinical evaluation, laboratory testing, radiography, observation reassessment  After These Interventions, the Patient was reevaluated and was found stable for discharge.  Patient with pelvic pain, cause unclear.  Urine ultrasound shows fibroid with masslike appearance.  Not clear if this is because of pain.  Patient will follow up with a GYN specialist for further evaluation and treatment of this.  She reports having fibroids previously.  No evidence for ovarian cyst at this time.  Low level of concern for vaginitis, patient not having vaginal discharge at this time.  Doubt STD.  GC chlamydia cultures are pending.  Routine RPR and HIV testing has been ordered.  CRITICAL CARE-no Performed by: Daleen Bo  Nursing Notes Reviewed/ Care Coordinated Applicable Imaging Reviewed Interpretation of Laboratory Data incorporated into ED treatment  The patient appears reasonably screened and/or stabilized for discharge and I doubt any other medical condition or other [facility] requiring further screening, evaluation, or treatment in the ED at this time prior to discharge.  Plan: Home Medications-ibuprofen for pain; Home Treatments-rest; return here if the recommended treatment, does not improve the symptoms; Recommended follow up-GYN follow-up for further evaluation and treatment.     Final Clinical Impression(s) / ED Diagnoses Final diagnoses:  Pelvic pain  Uterine leiomyoma, unspecified location    Rx / DC Orders ED Discharge Orders     None        Daleen Bo, MD 12/26/20 2039

## 2020-12-26 NOTE — Discharge Instructions (Addendum)
It is not clear what is causing your pelvic discomfort.  The ultrasound showed a uterine fibroid.  Call the gynecology office for a follow-up appointment to be evaluated and treated if needed.  For pain, use ibuprofen, 400 mg, 3 times a day with meals.

## 2020-12-27 LAB — GC/CHLAMYDIA PROBE AMP (~~LOC~~) NOT AT ARMC
Chlamydia: NEGATIVE
Comment: NEGATIVE
Comment: NORMAL
Neisseria Gonorrhea: NEGATIVE

## 2020-12-27 LAB — RPR: RPR Ser Ql: NONREACTIVE

## 2022-07-11 IMAGING — US US PELVIS COMPLETE WITH TRANSVAGINAL
2 of 3 series · 13 of 25 positions shown · non-contrast
Comparison: None

CLINICAL DATA: Pelvic pain.  Endometriosis.



[Series 1: us pelvis complete with transvaginal · 12 of 119 slices shown (1 of 2)]
[im 1/119]
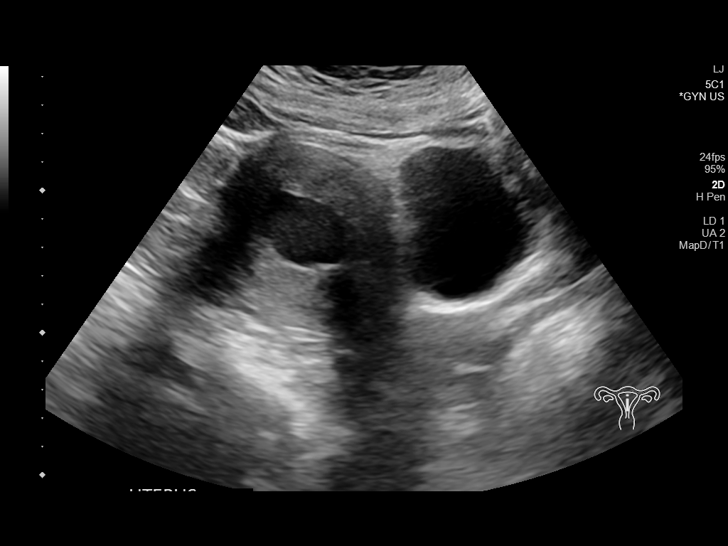
[im 11/119]
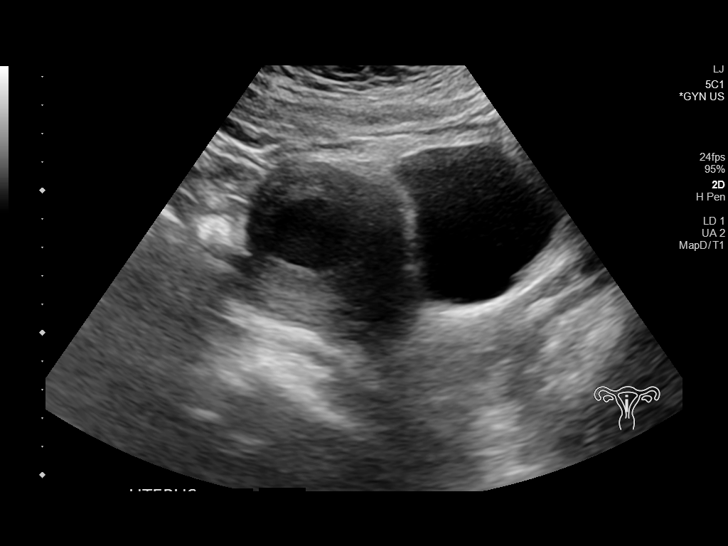
[im 22/119]
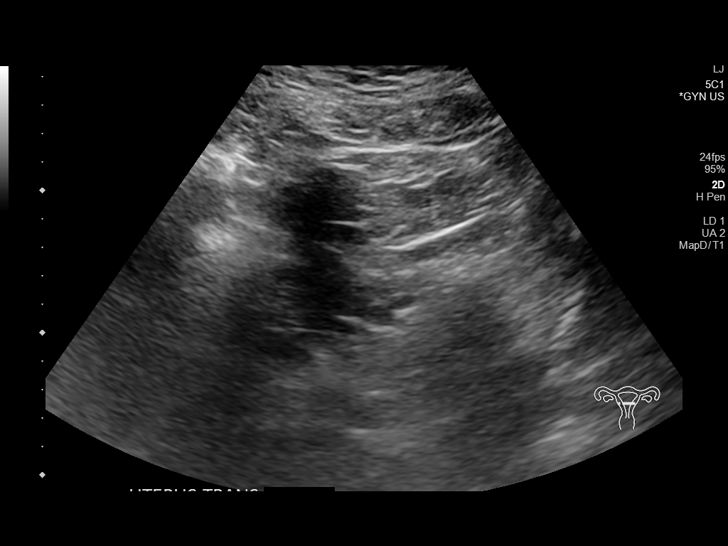
[im 33/119]
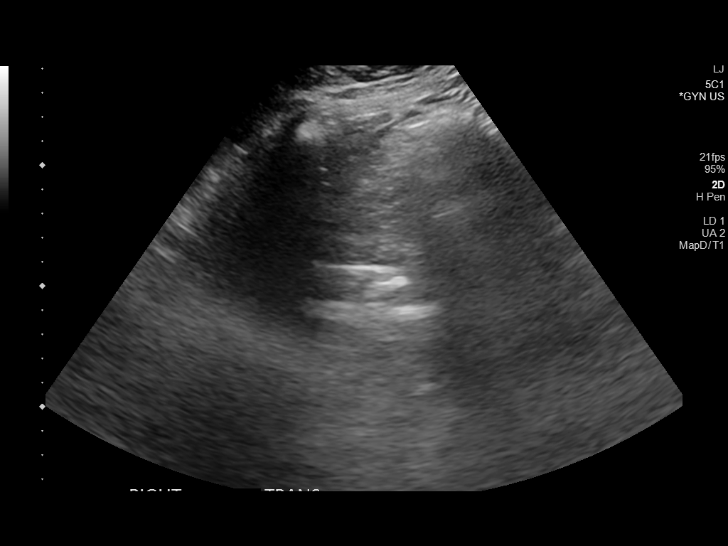
[im 43/119]
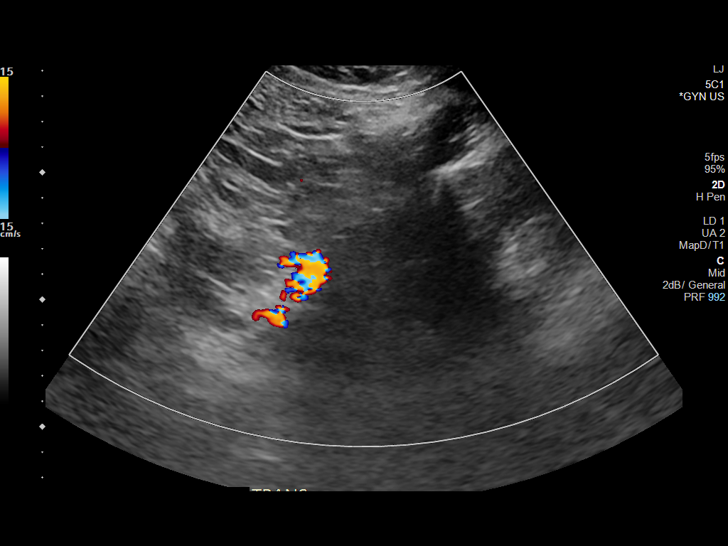
[im 54/119]
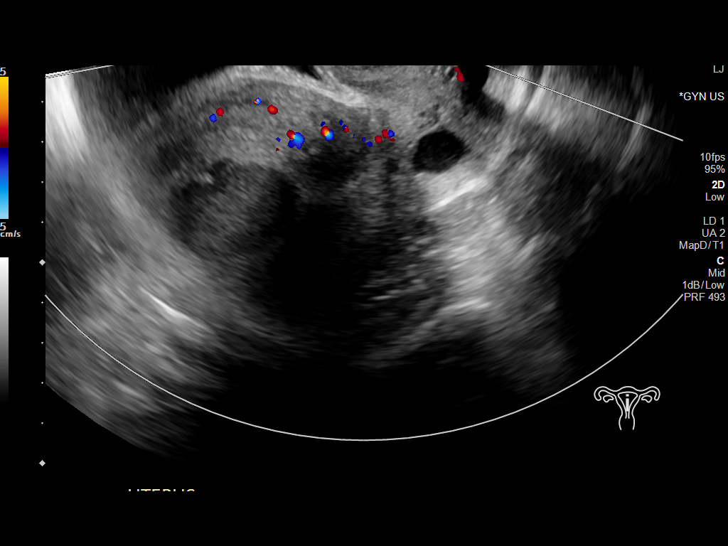
[im 65/119]
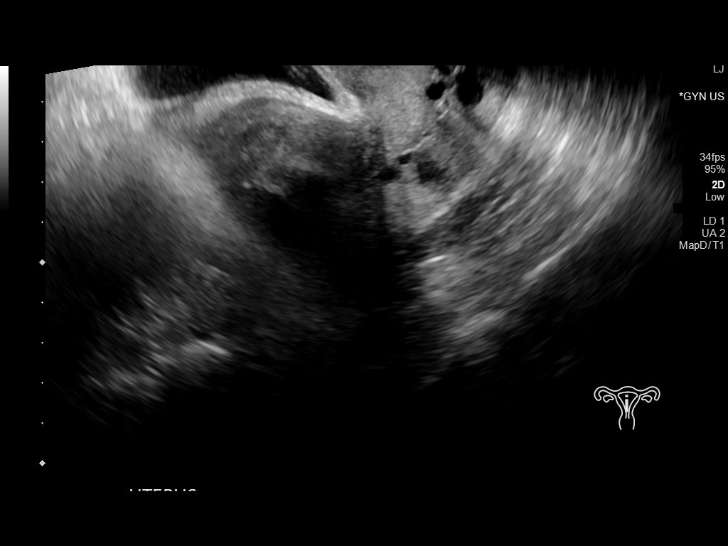
[im 76/119]
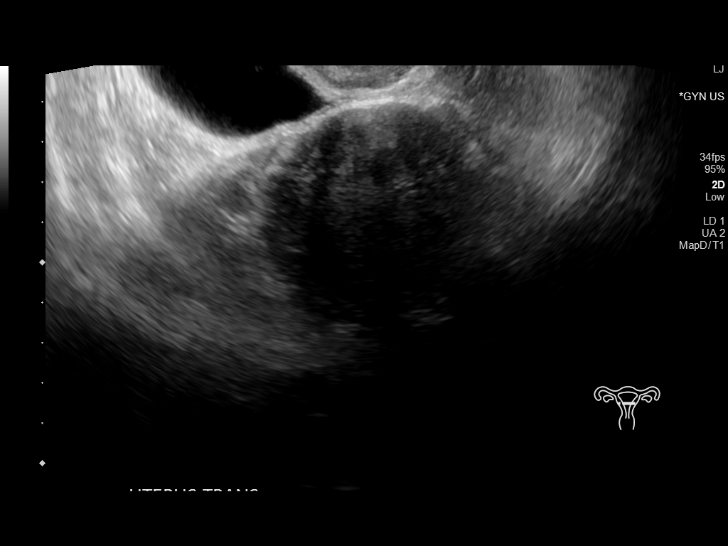
[im 86/119]
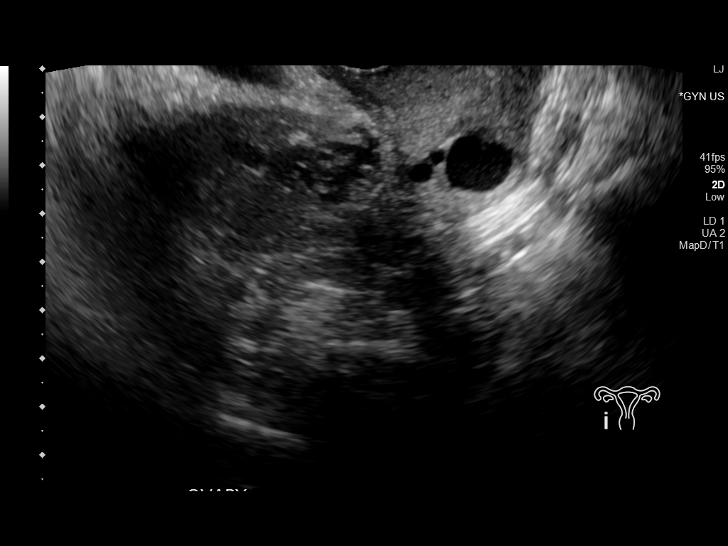
[im 97/119]
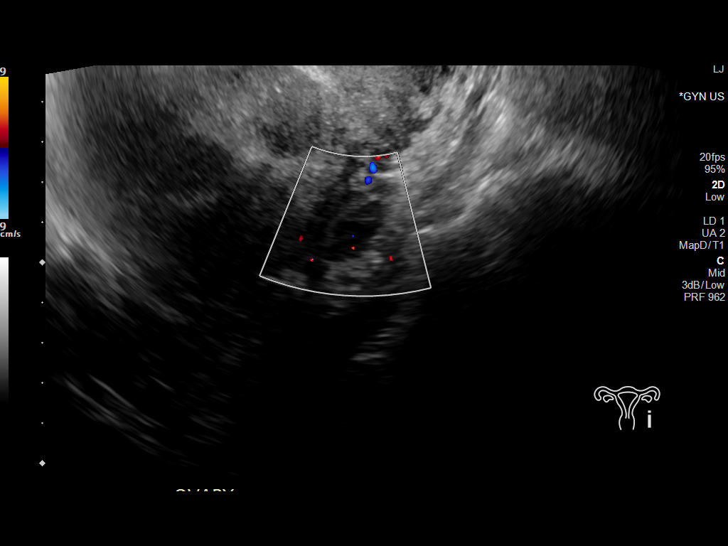
[im 108/119]
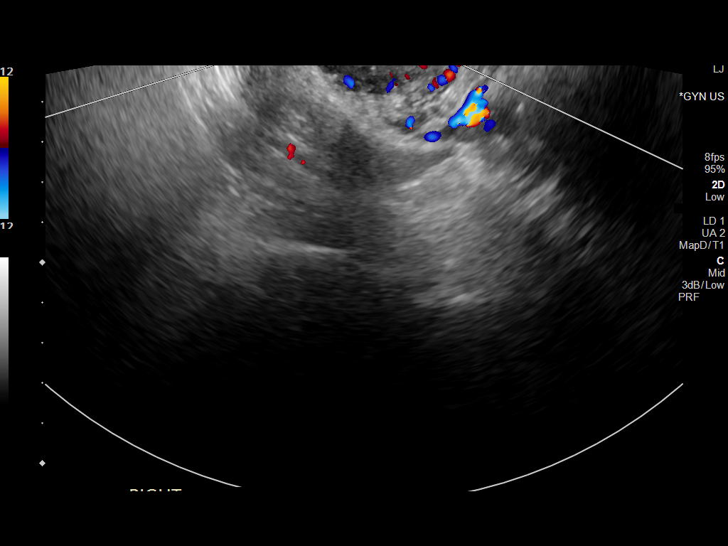
[im 119/119]
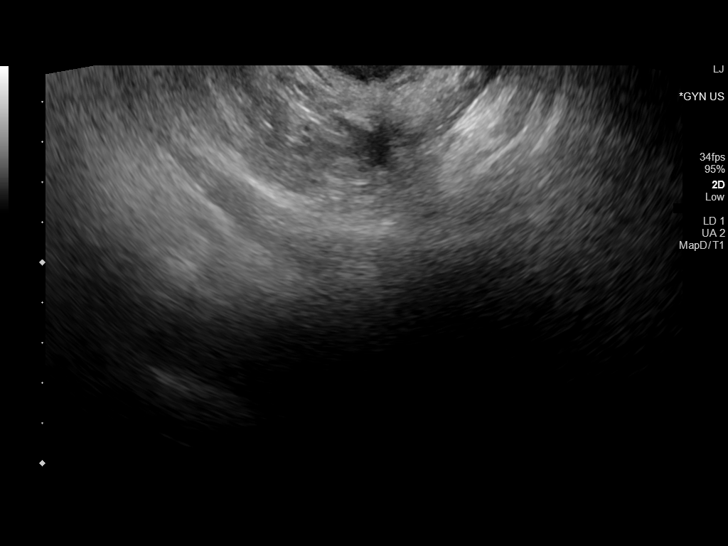

[Series 4: us pelvis complete with transvaginal · 1 of 1 slices shown (2 of 2)]
[im 1/1]
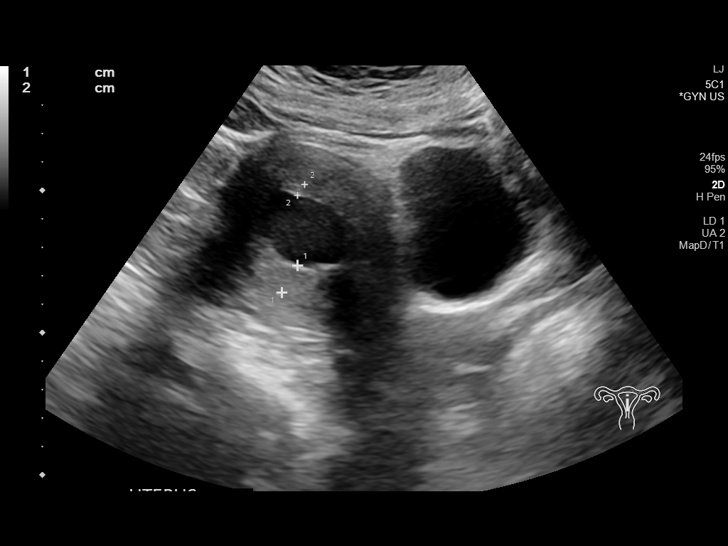

[13 of 25 positions shown; findings below may reference images not displayed]

FINDINGS: Uterus

Measurements: 12.2 x 6.3 x 8.1 cm = volume: 325 mL. Myometrial mass
in the anterior uterine fundus measures 2.3 x 1.8 x 2.2 cm.

Endometrium

Thickness: Cannot accurately measure, heterogeneous appearance.

Right ovary

Measurements: 2.5 x 1.4 x 1.7 cm = volume: 3.0 mL. Normal
appearance/no adnexal mass.

Left ovary

Measurements: 2.9 x 1.8 x 2.3 cm = volume: 6.2 mL. Normal
appearance/no adnexal mass.

Other findings

No abnormal free fluid.
IMPRESSION: Heterogeneous thickened endometrium with a focal masslike lesion
measuring 3.4 x 2.7 x 3.8 cm. Clinical correlation is recommended.
If further imaging is required, MRI of the pelvis with contrast may
be considered.

2.3 cm fundal intramural fibroid.

Normal appearance of the ovaries.

## 2024-05-15 ENCOUNTER — Telehealth: Payer: Self-pay | Admitting: Internal Medicine

## 2024-05-15 NOTE — Telephone Encounter (Signed)
Patient's referral is in media tab.
